# Patient Record
Sex: Male | Born: 1960 | Race: White | Hispanic: No | Marital: Married | Smoking: Never smoker
Health system: Southern US, Community
[De-identification: ages and names within clinical notes are randomized; demographics above are authoritative.]

---

## 2016-04-11 ENCOUNTER — Encounter: Payer: Self-pay | Admitting: Psychiatry

## 2016-04-11 ENCOUNTER — Inpatient Hospital Stay
Admission: RE | Admit: 2016-04-11 | Discharge: 2016-04-14 | DRG: 897 | Disposition: A | Discharge status: Home / Self Care | Payer: BLUE CROSS/BLUE SHIELD | Source: Intra-hospital | Attending: Psychiatry | Admitting: Psychiatry

## 2016-04-11 DIAGNOSIS — Z6838 Body mass index (BMI) 38.0-38.9, adult: Secondary | ICD-10-CM | POA: Diagnosis not present

## 2016-04-11 DIAGNOSIS — Z86718 Personal history of other venous thrombosis and embolism: Secondary | ICD-10-CM

## 2016-04-11 DIAGNOSIS — I82409 Acute embolism and thrombosis of unspecified deep veins of unspecified lower extremity: Secondary | ICD-10-CM

## 2016-04-11 DIAGNOSIS — Z7901 Long term (current) use of anticoagulants: Secondary | ICD-10-CM

## 2016-04-11 DIAGNOSIS — Z79899 Other long term (current) drug therapy: Secondary | ICD-10-CM | POA: Diagnosis not present

## 2016-04-11 DIAGNOSIS — F29 Unspecified psychosis not due to a substance or known physiological condition: Secondary | ICD-10-CM | POA: Diagnosis present

## 2016-04-11 DIAGNOSIS — M109 Gout, unspecified: Secondary | ICD-10-CM | POA: Diagnosis present

## 2016-04-11 DIAGNOSIS — I10 Essential (primary) hypertension: Secondary | ICD-10-CM | POA: Diagnosis present

## 2016-04-11 DIAGNOSIS — F1210 Cannabis abuse, uncomplicated: Secondary | ICD-10-CM

## 2016-04-11 DIAGNOSIS — F12150 Cannabis abuse with psychotic disorder with delusions: Secondary | ICD-10-CM | POA: Diagnosis present

## 2016-04-11 MED ORDER — MAGNESIUM HYDROXIDE 400 MG/5ML PO SUSP: 30.0000 mL | Freq: Every day | ORAL | Status: DC | PRN

## 2016-04-11 MED ORDER — DIPHENHYDRAMINE HCL 50 MG/ML IJ SOLN: 50.0000 mg | Freq: Three times a day (TID) | INTRAMUSCULAR | Status: DC | PRN

## 2016-04-11 MED ORDER — FINASTERIDE 5 MG PO TABS
2.5000 mg | ORAL_TABLET | Freq: Every day | ORAL | Status: DC
  Administered 2016-04-12: 2.5 mg via ORAL
  Filled 2016-04-11: qty 0.5

## 2016-04-11 MED ORDER — ACETAMINOPHEN 325 MG PO TABS
650.0000 mg | ORAL_TABLET | Freq: Four times a day (QID) | ORAL | Status: DC | PRN
Start: 2016-04-11 — End: 2016-04-13

## 2016-04-11 MED ORDER — ATORVASTATIN CALCIUM 20 MG PO TABS: 10.0000 mg | ORAL_TABLET | Freq: Every day | ORAL | Status: DC

## 2016-04-11 MED ORDER — ALUM & MAG HYDROXIDE-SIMETH 200-200-20 MG/5ML PO SUSP: 30.0000 mL | ORAL | Status: DC | PRN

## 2016-04-11 MED ORDER — RIVAROXABAN 20 MG PO TABS
20.0000 mg | ORAL_TABLET | Freq: Every day | ORAL | Status: DC
  Administered 2016-04-12 – 2016-04-13 (×2): 20 mg via ORAL
  Filled 2016-04-11 (×2): qty 1

## 2016-04-11 MED ORDER — DIPHENHYDRAMINE HCL 25 MG PO CAPS: 50.0000 mg | ORAL_CAPSULE | Freq: Three times a day (TID) | ORAL | Status: DC | PRN

## 2016-04-11 MED ORDER — HALOPERIDOL 5 MG PO TABS: 5.0000 mg | ORAL_TABLET | Freq: Three times a day (TID) | ORAL | Status: DC | PRN

## 2016-04-11 MED ORDER — LORAZEPAM 2 MG/ML IJ SOLN: 2.0000 mg | INTRAMUSCULAR | Status: DC | PRN

## 2016-04-11 MED ORDER — HYDROCHLOROTHIAZIDE 12.5 MG PO CAPS
12.5000 mg | ORAL_CAPSULE | Freq: Every day | ORAL | Status: DC
  Administered 2016-04-12 – 2016-04-13 (×2): 12.5 mg via ORAL
  Filled 2016-04-11 (×3): qty 1

## 2016-04-11 MED ORDER — LORAZEPAM 2 MG PO TABS: 2.0000 mg | ORAL_TABLET | ORAL | Status: DC | PRN

## 2016-04-11 MED ORDER — LISINOPRIL 10 MG PO TABS
20.0000 mg | ORAL_TABLET | Freq: Every day | ORAL | Status: DC
  Administered 2016-04-12 – 2016-04-14 (×3): 20 mg via ORAL
  Filled 2016-04-11 (×3): qty 2

## 2016-04-11 MED ORDER — HALOPERIDOL LACTATE 5 MG/ML IJ SOLN
5.0000 mg | Freq: Three times a day (TID) | INTRAMUSCULAR | Status: DC | PRN
Start: 2016-04-11 — End: 2016-04-14

## 2016-04-11 MED ORDER — DOCUSATE SODIUM 100 MG PO CAPS
100.0000 mg | ORAL_CAPSULE | Freq: Two times a day (BID) | ORAL | Status: DC
  Administered 2016-04-12: 100 mg via ORAL
  Filled 2016-04-11: qty 1

## 2016-04-11 NOTE — BH Assessment (Signed)
Patient has been accepted to [facility].  Patient assigned to room 23 Accepting physician is Dr. Ardyth HarpsHernandez Attending physician is Dr. Jennet MaduroPucilowska Call report to [phone] ER Nurse Zenda AlpersMaddi at [facility] made aware of acceptance, phone number to call, and accepting doctor.
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
## 2016-04-11 NOTE — Tx Team (Signed)
Initial Treatment Plan 04/11/2016 10:45 PM Jeffrey DialBrian Keith Sosa [id number]    PATIENT STRESSORS: Marital or family conflict Medication change or noncompliance   PATIENT STRENGTHS: Ability for insight Capable of independent living   PATIENT IDENTIFIED PROBLEMS: Axiety  Hallucinations  Delusions  Aggressive behavior               DISCHARGE CRITERIA:  Ability to meet basic life and health needs Improved stabilization in mood, thinking, and/or behavior Reduction of life-threatening or endangering symptoms to within safe limits  PRELIMINARY DISCHARGE PLAN: Attend PHP/IOP Return to previous living arrangement  PATIENT/FAMILY INVOLVEMENT: This treatment plan has been presented to and reviewed with the patient, Jeffrey Greer.  The patient has been given the opportunity to ask questions and make suggestions.  Olin PiaByungura, Samanvi Cuccia, RN 04/11/2016, 10:45 PM
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
## 2016-04-12 ENCOUNTER — Inpatient Hospital Stay: Payer: BLUE CROSS/BLUE SHIELD

## 2016-04-12 ENCOUNTER — Encounter: Payer: Self-pay | Admitting: Psychiatry

## 2016-04-12 DIAGNOSIS — M109 Gout, unspecified: Secondary | ICD-10-CM

## 2016-04-12 DIAGNOSIS — I82409 Acute embolism and thrombosis of unspecified deep veins of unspecified lower extremity: Secondary | ICD-10-CM

## 2016-04-12 DIAGNOSIS — I10 Essential (primary) hypertension: Secondary | ICD-10-CM

## 2016-04-12 DIAGNOSIS — F1210 Cannabis abuse, uncomplicated: Secondary | ICD-10-CM

## 2016-04-12 DIAGNOSIS — F29 Unspecified psychosis not due to a substance or known physiological condition: Secondary | ICD-10-CM

## 2016-04-12 LAB — COMPREHENSIVE METABOLIC PANEL
ALBUMIN: 3.8 g/dL (ref 3.5–5.0)
ALK PHOS: 67 U/L (ref 38–126)
ALT: 15 U/L — ABNORMAL LOW (ref 17–63)
ANION GAP: 7 (ref 5–15)
AST: 19 U/L (ref 15–41)
BILIRUBIN TOTAL: 1.1 mg/dL (ref 0.3–1.2)
BUN: 10 mg/dL (ref 6–20)
CALCIUM: 9.3 mg/dL (ref 8.9–10.3)
CO2: 31 mmol/L (ref 22–32)
Chloride: 103 mmol/L (ref 101–111)
Creatinine, Ser: 0.94 mg/dL (ref 0.61–1.24)
GFR calc Af Amer: >60 mL/min (ref >60)
GFR calc non Af Amer: >60 mL/min (ref >60)
GLUCOSE: 114 mg/dL — AB (ref 65–99)
Potassium: 3.7 mmol/L (ref 3.5–5.1)
Sodium: 141 mmol/L (ref 135–145)
TOTAL PROTEIN: 7.5 g/dL (ref 6.5–8.1)

## 2016-04-12 LAB — VITAMIN B12: VITAMIN B 12: 227 pg/mL (ref 180–914)

## 2016-04-12 LAB — RAPID HIV SCREEN (HIV 1/2 AB+AG)
HIV 1/2 ANTIBODIES: NONREACTIVE
HIV-1 P24 ANTIGEN - HIV24: NONREACTIVE

## 2016-04-12 LAB — CBC
HCT: 45 % (ref 40.0–52.0)
Hemoglobin: 15.3 g/dL (ref 13.0–18.0)
MCH: 31.6 pg (ref 26.0–34.0)
MCHC: 34.1 g/dL (ref 32.0–36.0)
MCV: 92.7 fL (ref 80.0–100.0)
Platelets: 190 10*3/uL (ref 150–440)
RBC: 4.85 MIL/uL (ref 4.40–5.90)
RDW: 14.2 % (ref 11.5–14.5)
WBC: 8.2 10*3/uL (ref 3.8–10.6)

## 2016-04-12 LAB — LIPID PANEL
Cholesterol: 150 mg/dL (ref 0–200)
HDL: 46 mg/dL (ref >40)
LDL Cholesterol: 87 mg/dL (ref 0–99)
Total CHOL/HDL Ratio: 3.3 RATIO
Triglycerides: 86 mg/dL (ref <150)
VLDL: 17 mg/dL (ref 0–40)

## 2016-04-12 LAB — TSH: TSH: 0.826 u[IU]/mL (ref 0.350–4.500)

## 2016-04-12 LAB — AMMONIA: Ammonia: 13 umol/L (ref 9–35)

## 2016-04-12 LAB — GLUCOSE, CAPILLARY: GLUCOSE-CAPILLARY: 97 mg/dL (ref 65–99)

## 2016-04-12 MED ORDER — FINASTERIDE 5 MG PO TABS
2.5000 mg | ORAL_TABLET | Freq: Every day | ORAL | Status: DC
  Administered 2016-04-13: 2.5 mg via ORAL
  Filled 2016-04-12 (×2): qty 1

## 2016-04-12 MED ORDER — ALLOPURINOL 100 MG PO TABS
200.0000 mg | ORAL_TABLET | Freq: Every day | ORAL | Status: DC
  Administered 2016-04-12 – 2016-04-14 (×3): 200 mg via ORAL
  Filled 2016-04-12 (×3): qty 2

## 2016-04-12 NOTE — [facility] Suicide Risk Assessment (Signed)
[facility]   Nursing information obtained from:    Demographic factors:    Current Mental Status:    Loss Factors:    Historical Factors:    Risk Reduction Factors:     Total Time spent with patient: 1 hour Principal Problem: Psychosis Diagnosis:   Patient Active Problem List   Diagnosis Date Noted  . Morbid obesity (HCC) [E66.01] 04/12/2016  . Gout [M10.9] 04/12/2016  . HTN (hypertension) [I10] 04/12/2016  . Cannabis use disorder, mild, abuse [F12.10] 04/12/2016  . Unspecified schizophrenia spectrum disorder [F29] 04/12/2016  . past hisoty of DVT (deep venous thrombosis) (HCC) [I82.409] 04/12/2016   Subjective Data:   Continued Clinical Symptoms:  Alcohol Use Disorder Identification Test Final Score (AUDIT): 0 The "Alcohol Use Disorders Identification Test", Guidelines for Use in Primary Care, Second Edition.  World Science writerHealth Organization [facility](WHO). Score between 0-7:  no or low risk or alcohol related problems. Score between 8-15:  moderate risk of alcohol related problems. Score between 16-19:  high risk of alcohol related problems. Score 20 or above:  warrants further diagnostic evaluation for alcohol dependence and treatment.   CLINICAL FACTORS:   Alcohol/Substance Abuse/Dependencies Currently Psychotic Medical Diagnoses and Treatments/Surgeries     Psychiatric Specialty Exam: Physical Exam  ROS  Blood pressure (!) 144/90, pulse 78, temperature 98.1 F (36.7 C), temperature source Oral, resp. rate 18, height 6\' 3"  (1.905 m), weight (!) 138.8 kg (306 lb), SpO2 100 %.Body mass index is 38.25 kg/m.                                                    Sleep:  Number of Hours: 6.15      COGNITIVE FEATURES THAT CONTRIBUTE TO RISK:  Loss of executive function    SUICIDE RISK:   Moderate:  Frequent suicidal ideation with limited intensity, and duration, some specificity in terms of plans, no associated intent, good  self-control, limited dysphoria/symptomatology, some risk factors present, and identifiable protective factors, including available and accessible social support.   [facility] OF CARE[facility]  I certify that inpatient services furnished can reasonably be expected to improve the patient's condition.  Jimmy FootmanHernandez-Gonzalez,  Alexys Gassett, MD 04/12/2016, 12:26 PM
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
## 2016-04-12 NOTE — [facility] Suicide Risk Assessment (Signed)
[facility] INPATIENT:  Family/Significant Other Suicide Prevention Education  Suicide Prevention Education:  Education Completed; Electa SniffGeraldine McCollum, friend, [phone], has been identified by the patient as the family member/significant other with whom the patient will be residing, and identified as the person(s) who will aid the patient in the event of a mental health crisis (suicidal ideations/suicide attempt).  With written consent from the patient, the family member/significant other has been provided the following suicide prevention education, prior to the and/or following the discharge of the patient.  The suicide prevention education provided includes the following:  Suicide risk factors  Suicide prevention and interventions  National Suicide Hotline telephone number  [facility] assessment telephone number  [facility] Emergency Assistance 911  [facility] and/or Residential Mobile Crisis Unit telephone number  Request made of family/significant other to:  Remove weapons (e.g., guns, rifles, knives), all items previously/currently identified as safety concern.    Remove drugs/medications (over-the-counter, prescriptions, illicit drugs), all items previously/currently identified as a safety concern.  The family member/significant other verbalizes understanding of the suicide prevention education information provided.   Alvira PhilipsGeraldine reports pt has multiple guns and is a Tourist information centre managercollector.  She was unable to agree that she could secure all of these guns.  She was agreeable to checking in regularly with pt regarding any safety concerns.   Lorri FrederickWierda, Antrone Walla Jon, LCSW 04/12/2016, 3:09 PM

## 2016-04-12 NOTE — Tx Team (Signed)
Interdisciplinary Treatment and Diagnostic Plan Update  04/12/2016 Time of Session: 10:30am Jeffrey Greer MRN: [id number]  Principal Diagnosis: <principal problem not specified>  Secondary Diagnoses: Active Problems:   Psychosis   Current Medications:  Current Facility-Administered Medications  Medication Dose Route Frequency Provider Last Rate Last Dose  . acetaminophen (TYLENOL) tablet 650 mg  650 mg Oral Q6H PRN Jimmy FootmanAndrea Hernandez-Gonzalez, MD      . alum & mag hydroxide-simeth (MAALOX/MYLANTA) 200-200-20 MG/5ML suspension 30 mL  30 mL Oral Q4H PRN Jimmy FootmanAndrea Hernandez-Gonzalez, MD      . diphenhydrAMINE (BENADRYL) capsule 50 mg  50 mg Oral Q8H PRN Jimmy FootmanAndrea Hernandez-Gonzalez, MD       Or  . diphenhydrAMINE (BENADRYL) injection 50 mg  50 mg Intramuscular Q8H PRN Jimmy FootmanAndrea Hernandez-Gonzalez, MD      . Melene Muller[START ON 04/13/2016] finasteride (PROSCAR) tablet 2.5 mg  2.5 mg Oral QHS Jimmy FootmanAndrea Hernandez-Gonzalez, MD      . haloperidol (HALDOL) tablet 5 mg  5 mg Oral Q8H PRN Jimmy FootmanAndrea Hernandez-Gonzalez, MD       Or  . haloperidol lactate (HALDOL) injection 5 mg  5 mg Intramuscular Q8H PRN Jimmy FootmanAndrea Hernandez-Gonzalez, MD      . hydrochlorothiazide (MICROZIDE) capsule 12.5 mg  12.5 mg Oral Daily Jimmy FootmanAndrea Hernandez-Gonzalez, MD   12.5 mg at 04/12/16 [id number]  . lisinopril (PRINIVIL,ZESTRIL) tablet 20 mg  20 mg Oral Daily Jimmy FootmanAndrea Hernandez-Gonzalez, MD   20 mg at 04/12/16 [id number]  . LORazepam (ATIVAN) tablet 2 mg  2 mg Oral Q4H PRN Jimmy FootmanAndrea Hernandez-Gonzalez, MD       Or  . LORazepam (ATIVAN) injection 2 mg  2 mg Intramuscular Q4H PRN Jimmy FootmanAndrea Hernandez-Gonzalez, MD      . magnesium hydroxide (MILK OF MAGNESIA) suspension 30 mL  30 mL Oral Daily PRN Jimmy FootmanAndrea Hernandez-Gonzalez, MD      . rivaroxaban Carlena Hurl(XARELTO) tablet 20 mg  20 mg Oral Q supper Jimmy FootmanAndrea Hernandez-Gonzalez, MD       PTA Medications: No prescriptions prior to admission.    Patient Stressors: Marital or family conflict Medication change or  noncompliance  Patient Strengths: Ability for insight Capable of independent living  Treatment Modalities: Medication Management, Group therapy, Case management,  1 to 1 session with clinician, Psychoeducation, Recreational therapy.   Physician Treatment Plan for Primary Diagnosis: <principal problem not specified> Long Term Goal(s):     Short Term Goals:    Medication Management: Evaluate patient's response, side effects, and tolerance of medication regimen.  Therapeutic Interventions: 1 to 1 sessions, Unit Group sessions and Medication administration.  Evaluation of Outcomes: Progressing  Physician Treatment Plan for Secondary Diagnosis: Active Problems:   Psychosis  Long Term Goal(s):     Short Term Goals:       Medication Management: Evaluate patient's response, side effects, and tolerance of medication regimen.  Therapeutic Interventions: 1 to 1 sessions, Unit Group sessions and Medication administration.  Evaluation of Outcomes: Progressing   RN Treatment Plan for Primary Diagnosis: <principal problem not specified> Long Term Goal(s): Knowledge of disease and therapeutic regimen to maintain health will improve  Short Term Goals: Ability to verbalize frustration and anger appropriately will improve, Ability to participate in decision making will improve and Ability to identify and develop effective coping behaviors will improve  Medication Management: RN will administer medications as ordered by provider, will assess and evaluate patient's response and provide education to patient for prescribed medication. RN will report any adverse and/or side effects to prescribing provider.  Therapeutic Interventions: 1 on 1  counseling sessions, Psychoeducation, Medication administration, Evaluate responses to treatment, Monitor vital signs and CBGs as ordered, Perform/monitor CIWA, COWS, AIMS and Fall Risk screenings as ordered, Perform wound care treatments as  ordered.  Evaluation of Outcomes: Progressing   LCSW Treatment Plan for Primary Diagnosis: <principal problem not specified> Long Term Goal(s): Safe transition to appropriate next level of care at discharge, Engage patient in therapeutic group addressing interpersonal concerns.  Short Term Goals: Engage patient in aftercare planning with referrals and resources, Increase social support, Increase ability to appropriately verbalize feelings, Increase emotional regulation, Facilitate acceptance of mental health diagnosis and concerns and Increase skills for wellness and recovery  Therapeutic Interventions: Assess for all discharge needs, 1 to 1 time with Social worker, Explore available resources and support systems, Assess for adequacy in community support network, Educate family and significant other(s) on suicide prevention, Complete Psychosocial Assessment, Interpersonal group therapy.  Evaluation of Outcomes: Progressing   Progress in Treatment: Attending groups: No. CSW still assessing, pt new to milieu Participating in groups: No. CSW still assessing, pt new to milieu Taking medication as prescribed: Yes. Toleration medication: Yes. Family/Significant other contact made: No, will contact:  pt's family Patient understands diagnosis: Yes. Discussing patient identified problems/goals with staff: Yes. Medical problems stabilized or resolved: Yes. Denies suicidal/homicidal ideation: Yes. Issues/concerns per patient self-inventory: Yes. Other: none listed   New problem(s) identified: No, Describe:  none listed  New Short Term/Long Term Goal(s):  Discharge Plan or Barriers:   Reason for Continuation of Hospitalization: Anxiety Delusions  Depression Hallucinations Mania  Estimated Length of Stay: 3-5 days  Attendees: [name] 04/12/2016 10:46 AM  Physician: Dr. Ardyth HarpsHernandez, MD 04/12/2016 10:46 AM  Nursing: Elenore PaddyJennifer Morrow, R 04/12/2016 10:46 AM  RN Care Manager:  04/12/2016 10:46 AM  Social Worker: Jeffrey Greer, LCSWA, LCAS   04/12/2016 10:46 AM  Recreational Therapist: Hershal CoriaBeth Greene, LRT 04/12/2016 10:46 AM  Other:  04/12/2016 10:46 AM  Other:  04/12/2016 10:46 AM  Other: 04/12/2016 10:46 AM    Scribe for Treatment Team: Jeffrey PeaJonathan F Mirel Hundal, LCSWA 04/12/2016

## 2016-04-12 NOTE — Progress Notes (Signed)
Patient with sad affect, cooperative behavior with meals and plan of care. No SI/HI at this time. Meets with MD to discuss plan of care. MD considers med adjustments. Orient to unit and therapy groups encouraged. Safety maintained.

## 2016-04-12 NOTE — Progress Notes (Signed)
Recreation Therapy Notes  Date: 12.20.17 Time: 9:30 am Location: Craft Room  Group Topic: Self-esteem  Goal Area(s) Addresses:  Patient will write at least one positive trait. Patient will verbalize benefit of having healthy self-esteem.  Behavioral Response: Did not attend  Intervention: I Am  Activity: Patients were given a worksheet with the letter I on it and were instructed to write as many positive traits inside the letter.  Education: LRT educated patients on ways they can increase their self-esteem.  Education Outcome: Patient did not attend group.  Clinical Observations/Feedback: Patient did not attend group.  Jacquelynn CreeGreene,Eldene Plocher M, LRT/CTRS 04/12/2016 10:04 AM

## 2016-04-12 NOTE — [facility] Group Notes (Signed)
[facility] Group Notes:  (Nursing/MHT/Case Management/Adjunct)  Date:  04/12/2016  Time:  4:40 PM  Type of Therapy:  Psychoeducational Skills  Participation Level:  Did Not Attend  Twanna Hymanda C Jamilex Bohnsack 04/12/2016, 4:40 PM

## 2016-04-12 NOTE — Progress Notes (Signed)
Skin search done with Royetta Crochetina RN. Patient lower extremities are cyanotic and ashy. They are also edematous, both are 3+. No contraband found.
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
## 2016-04-12 NOTE — H&P (Addendum)
Psychiatric Admission Assessment Adult  Patient Identification: Jeffrey Greer MRN:  [id number] Date of Evaluation:  04/12/2016 Chief Complaint:  Psychotic Disorder  Principal Diagnosis: Psychosis Diagnosis:   Patient Active Problem List   Diagnosis Date Noted  . Morbid obesity ([place]) [E66.01] 04/12/2016  . Gout [M10.9] 04/12/2016  . HTN (hypertension) [I10] 04/12/2016  . Cannabis use disorder, mild, abuse [F12.10] 04/12/2016  . Unspecified schizophrenia spectrum disorder [F29] 04/12/2016  . past hisoty of DVT (deep venous thrombosis) (H[facility]) [I82.409] 04/12/2016   History of Present Illness:   The patient is a 55 year old separated Caucasian male from [place]. He was referred to our unit by [facility] emergency department. The patient was brought to [facility] on by police under petition. Per the notes on December 16 the police was called out to the home after family is stated patient was threatening them with knives. Patient was found on with a gun and had a stand off with police for 4 hours. He had disjointed thoughts, delusions of grandeur. Obsession with religious ideology. He stated he was shooting holes in the ground to ward off evil one. Afraid he is being poisoned by neighbors. Believes that he had a tracking device planted in him.  On December 17 per [place]'s notes patient was labile, bizarre and had flight of ideas he appeared to be responding to internal stimuli. He was again reported that his neighbors were poisoning his water.  Urine toxicology was positive for marijuana. Alcohol was below detection limit. CBC and comprehensive metabolic panel were within the normal limits. Vital signs were within the normal limits.  Patient denies having any guns with him when the police came to his house. He doesn't know who called the police as he says he lives alone. He denies thinking that his neighbors are trying to harm him. He denies thinking that there were spying on him  or that anybody has implanted a tracking device on him. Patient tells me he is very hospitalized or treated for any psychiatric conditions in the past.  He denies depressed mood, problems with sleep, appetite, energy or concentration. He denies auditory or visual hallucinations. He denies any symptoms consistent with mania or hypomania.  As far as substance abuse he denies the use of alcohol or any illicit substances. He denies the use of cigarettes. He was positive for marijuana in his tox screen  Trauma history he denies any history of sexual or physical abuse or any other traumatic events in the past.  During assessment the patient was vague and guarded. He is focused on returning back to work because he is tired of being at home not doing anything. Patient says his aggravated by his neighbors who are using drugs and keep asking him for money.  Associated Signs/Symptoms: Depression Symptoms:  denies (Hypo) Manic Symptoms:  Labiality of Mood, Anxiety Symptoms:  denies Psychotic Symptoms:  Paranoia, PTSD Symptoms: Negative Total Time spent with patient: 1 hour  Past Psychiatric History: Patient denies any prior psychiatric treatment. Denies any history of suicidal attempts, psychiatric hospitalizations, denies history of self injury.  Is the patient at risk to self? Yes.    Has the patient been a risk to self in the past 6 months? No.  Has the patient been a risk to self within the distant past? No.  Is the patient a risk to others? Yes.    Has the patient been a risk to others in the past 6 months? No.  Has the patient been a risk  to others within the distant past? No.   Prior Inpatient Therapy: Prior Inpatient Therapy: No Prior Outpatient Therapy: Prior Outpatient Therapy: No Does patient have an ACCT team?: No Does patient have Intensive In-House Services?  : No Does patient have [facility] services? : No  Alcohol Screening: Patient refused Alcohol Screening Tool: Yes 1. How often  do you have a drink containing alcohol?: Never 9. Have you or someone else been injured as a result of your drinking?: No 10. Has a relative or friend or a doctor or another health worker been concerned about your drinking or suggested you cut down?: No Alcohol Use Disorder Identification Test Final Score (AUDIT): 0 Brief Intervention: Patient declined brief intervention  Past Medical History: Patient reports a past history of deep venous [place] cyst on his left leg which he is on direct Xarelto. He takes medications for hypertension. Says that in the past he was diagnosed with diabetes and dyslipidemia but is no longer prescribed medications for these disorders as he lost about 200 pounds in 1 year.  Denies any history of seizures or head trauma  Family History: History reviewed. No pertinent family history.   Family Psychiatric  History: Patient says he was adopted and doesn't know much about his biological family  Tobacco Screening: Have you used any form of tobacco in the last 30 days? (Cigarettes, Smokeless Tobacco, Cigars, and/or Pipes): No   Social History: Patient says he lives alone in a house in [place]. He has been on disability for several years due to morbid obesity and DVTs. 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 He did some odd jobs after that but has been at home unemployed for at least 4 years. Patient has been married twice. He divorced his first wife and then remarried. He is what led to being in June of this year. Patient has 2 children ages 27 and 83 but the patient has no contact with them. 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 He denies any Careers adviser. As far as his legal history patient says that in the past he was discharged with having a gun that was not register but he says that the charges were dropped.  Patient says he was adopted he is adopted  parents have passed away. He has an adopted sister but does not have any contact with her History  Alcohol use Not on file     History  Drug Use No    Additional Social History:      Pain Medications: see MAR Prescriptions: see MAR Over the Counter: see MAR History of alcohol / drug use?: No history of alcohol / drug abuse     Allergies:  Not on File   Lab Results:  Results for orders placed or performed during the hospital encounter of 04/11/16 (from the past 48 hour(s))  CBC     Status: None   Collection Time: 04/12/16  6:37 AM  Result Value Ref Range   WBC 8.2 3.8 - 10.6 K/uL   RBC 4.85 4.40 - 5.90 MIL/uL   Hemoglobin 15.3 13.0 - 18.0 g/dL   HCT 45.0 40.0 - 52.0 %   MCV 92.7 80.0 - 100.0 fL   MCH 31.6 26.0 - 34.0 pg   MCHC 34.1 32.0 - 36.0 g/dL   RDW 14.2 11.5 - 14.5 %   Platelets 190 150 - 440 K/uL  Comprehensive metabolic panel     Status: Abnormal   Collection Time: 04/12/16  6:37 AM  Result Value Ref Range   Sodium 141 135 - 145 mmol/L   Potassium 3.7 3.5 - 5.1 mmol/L   Chloride 103 101 - 111 mmol/L   CO2 31 22 - 32 mmol/L   Glucose, Bld 114 (H) 65 - 99 mg/dL   BUN 10 6 - 20 mg/dL   Creatinine, Ser 0.94 0.61 - 1.24 mg/dL   Calcium 9.3 8.9 - 10.3 mg/dL   Total Protein 7.5 6.5 - 8.1 g/dL   Albumin 3.8 3.5 - 5.0 g/dL   AST 19 15 - 41 U/L   ALT 15 (L) 17 - 63 U/L   Alkaline Phosphatase 67 38 - 126 U/L   Total Bilirubin 1.1 0.3 - 1.2 mg/dL   GFR calc non Af Amer >60 >60 mL/min   GFR calc Af Amer >60 >60 mL/min    Comment: (NOTE) The eGFR has been calculated using the CKD EPI equation. This calculation has not been validated in all clinical situations. eGFR's persistently <60 mL/min signify possible Chronic Kidney Disease.    Anion gap 7 5 - 15  Lipid panel     Status: None   Collection Time: 04/12/16  6:37 AM  Result Value Ref Range   Cholesterol 150 0 - 200 mg/dL   Triglycerides 86 <150 mg/dL   HDL 46 >40 mg/dL   Total CHOL/HDL Ratio 3.3 RATIO    VLDL 17 0 - 40 mg/dL   LDL Cholesterol 87 0 - 99 mg/dL    Comment:        Total Cholesterol/HDL:CHD Risk Coronary Heart Disease Risk Table                     Men   Women  1/2 Average Risk   3.4   3.3  Average Risk       5.0   4.4  2 X Average Risk   9.6   7.1  3 X Average Risk  23.4   11.0        Use the calculated Patient Ratio above and the CHD Risk Table to determine the patient's CHD Risk.        ATP III CLASSIFICATION (LDL):  <100     mg/dL   Optimal  100-129  mg/dL   Near or Above                    Optimal  130-159  mg/dL   Borderline  160-189  mg/dL   High  >190     mg/dL   Very High   TSH     Status: None   Collection Time: 04/12/16  6:37 AM  Result Value Ref Range   TSH 0.826 0.350 - 4.500 uIU/mL    Comment: Performed by a 3rd Generation assay with a functional sensitivity of <=0.01 uIU/mL.  Glucose, capillary     Status: None   Collection Time: 04/12/16  7:39 AM  Result Value Ref Range   Glucose-Capillary 97 65 - 99 mg/dL    Blood Alcohol level:  No results found for: [facility]  Metabolic Disorder Labs:  No results found for: HGBA1C, MPG No results found for: PROLACTIN Lab Results  Component Value Date   CHOL 150 04/12/2016   TRIG 86 04/12/2016   HDL 46 04/12/2016   CHOLHDL 3.3 04/12/2016   VLDL 17 04/12/2016   LDLCALC 87 04/12/2016  Current Medications: Current Facility-Administered Medications  Medication Dose Route Frequency Provider Last Rate Last Dose  . acetaminophen (TYLENOL) tablet 650 mg  650 mg Oral Q6H PRN Hildred Priest, MD      . alum & mag hydroxide-simeth (MAALOX/MYLANTA) 200-200-20 MG/5ML suspension 30 mL  30 mL Oral Q4H PRN Hildred Priest, MD      . diphenhydrAMINE (BENADRYL) capsule 50 mg  50 mg Oral Q8H PRN Hildred Priest, MD       Or  . diphenhydrAMINE (BENADRYL) injection 50 mg  50 mg Intramuscular Q8H PRN Hildred Priest, MD      . Derrill Memo ON 04/13/2016] finasteride (PROSCAR) tablet 2.5  mg  2.5 mg Oral QHS Hildred Priest, MD      . haloperidol (HALDOL) tablet 5 mg  5 mg Oral Q8H PRN Hildred Priest, MD       Or  . haloperidol lactate (HALDOL) injection 5 mg  5 mg Intramuscular Q8H PRN Hildred Priest, MD      . hydrochlorothiazide (MICROZIDE) capsule 12.5 mg  12.5 mg Oral Daily Hildred Priest, MD   12.5 mg at 04/12/16 4742  . lisinopril (PRINIVIL,ZESTRIL) tablet 20 mg  20 mg Oral Daily Hildred Priest, MD   20 mg at 04/12/16 5956  . LORazepam (ATIVAN) tablet 2 mg  2 mg Oral Q4H PRN Hildred Priest, MD       Or  . LORazepam (ATIVAN) injection 2 mg  2 mg Intramuscular Q4H PRN Hildred Priest, MD      . magnesium hydroxide (MILK OF MAGNESIA) suspension 30 mL  30 mL Oral Daily PRN Hildred Priest, MD      . rivaroxaban Alveda Reasons) tablet 20 mg  20 mg Oral Q supper Hildred Priest, MD       PTA Medications: No prescriptions prior to admission.    Musculoskeletal: Strength & Muscle Tone: within normal limits Gait & Station: normal Patient leans: N/A  Psychiatric Specialty Exam: Physical Exam  Constitutional: He is oriented to person, place, and time. He appears well-developed.  HENT:  Head: Normocephalic and atraumatic.  Eyes: Conjunctivae and EOM are normal.  Neck: Normal range of motion.  Respiratory: Effort normal.  Musculoskeletal: Normal range of motion.  Neurological: He is alert and oriented to person, place, and time.    Review of Systems  Constitutional: Negative.   HENT: Negative.   Eyes: Negative.   Respiratory: Negative.   Cardiovascular: Positive for leg swelling.  Gastrointestinal: Negative.   Genitourinary: Negative.   Musculoskeletal: Negative.   Skin: Positive for itching and rash.  Neurological: Negative.   Endo/Heme/Allergies: Negative.   Psychiatric/Behavioral: Negative.     Blood pressure (!) 144/90, pulse 78, temperature 98.1 F (36.7 C),  temperature source Oral, resp. rate 18, height 6' 3"  (1.905 m), weight (!) 138.8 kg (306 lb), SpO2 100 %.Body mass index is 38.25 kg/m.  General Appearance: Well Groomed  Eye Contact:  Good  Speech:  Clear and Coherent  Volume:  Normal  Mood:  Irritable  Affect:  Constricted  Thought Process:  Linear and Descriptions of Associations: Tangential  Orientation:  Full (Time, Place, and Person)  Thought Content:  Paranoid Ideation  Suicidal Thoughts:  No  Homicidal Thoughts:  No  Memory:  Immediate;   Fair Recent;   Good Remote;   Good  Judgement:  Impaired  Insight:  Lacking  Psychomotor Activity:  Normal  Concentration:  Concentration: Fair and Attention Span: Fair  Recall:  [employer] of Knowledge:  Fair  Language:  Kermit Balo  Akathisia:  No  Handed:    AIMS (if indicated):     Assets:  Agricultural consultant Housing  ADL's:  Intact  Cognition:  WNL  Sleep:  Number of Hours: 6.15    Treatment Plan Summary:  The patient is a 55 year old separated Caucasian male with no known prior psychiatric history. He was referred by [facility] emergency department due to aggression and bizarre behavior (paranoid delusions about his neighbors trying to poison him and having a tracking device inserted in his body)  For psychosis: no significant evidence of psychosis at this time, perhaps mild paranoia towards neighbors but he denies delusions. Will try to collect collateral from family.  Staff will observe closely.  Will hold off from starting antipsychotics  For agitation and aggression patient has orders for Haldol, Ativan and Benadryl when necessary IM or by mouth  For insomnia he has orders for Ativan when necessary  For history of DVT left leg he will be continued on Xarelto  For hypertension patient has been taking a combination pill with lisinopril and hydrochlorothiazide. He will be continued on hydrochlorothiazide 2.5 mg and lisinopril 20 mg a day  Past  history of diabetes: We will check hemoglobin A1c to rule out diabetes  Past history of dyslipidemia: We check a lipid panel which appeared to be in in the normal limits. Patient does not want to continue treatment with Lipitor will discontinue  Gout: Patient is states he has been taking allopurinol 100 mg day would like to continue this medication.  Cannabis use disorder patient denies the use of any alcohol or illicit substances. His urine toxicology screen was positive for cannabis. Upon discharge patient will be referred to outpatient substance abuse treatment  Diet carb modified and low sodium  Vital signs daily  Precautions every 15 minute checks  Hospitalization and status continue involuntary commitment  Labs: I will order B12, RPR, HIV, ammonia level, TSH  Imaging I will order a head CT without contrast  I reviewed his medical record. There are notes from his primary care provider Dr.Gallemore. There are no records indicating history of mental illness.  Dispo: once stable he will be d/c to his home  F/u: will set up a f/u with psychiatry.  Physician Treatment Plan for Primary Diagnosis: Psychosis Long Term Goal(s): Improvement in symptoms so as ready for discharge  Short Term Goals: Ability to identify changes in lifestyle to reduce recurrence of condition will improve, Ability to demonstrate self-control will improve, Ability to identify and develop effective coping behaviors will improve and Ability to identify triggers associated with substance abuse/mental health issues will improve  Physician Treatment Plan for Secondary Diagnosis: Principal Problem:   Unspecified schizophrenia spectrum disorder Active Problems:   Morbid obesity ([place])   Gout   HTN (hypertension)   Cannabis use disorder, mild, abuse   past hisoty of DVT (deep venous thrombosis) ([place])  Long Term Goal(s): Improvement in symptoms so as ready for discharge  Short Term Goals: Ability to demonstrate  self-control will improve and Ability to identify triggers associated with substance abuse/mental health issues will improve  I certify that inpatient services furnished can reasonably be expected to improve the patient's condition.    Hildred Priest, MD 12/20/201712:12 PM

## 2016-04-12 NOTE — Plan of Care (Signed)
Problem: Safety: Goal: Ability to redirect hostility and anger into socially appropriate behaviors will improve Outcome: Progressing No aggressive behavior or anger displayed

## 2016-04-12 NOTE — Progress Notes (Signed)
Patient was admitted and oriented to the unit. Had a snack and received sleeping medications. Currently in bed, sleeping soundly. No sign of discomfort noted. Safety precautions maintained.

## 2016-04-12 NOTE — Progress Notes (Signed)
April 13, 2016.  Patient Identification: Jeffrey Greer MRN:  [id number] Date of Evaluation:  04/12/2016 Chief Complaint:  Psychotic Disorder  Principal Diagnosis: Psychosis  To [facility]:  The patient is a 55 year old separated Caucasian male from [place]. He was referred to our unit [facility]. The patient was [facility] to [facility] by police under petition. Per the notes on December 16 the police was called out to the home after family stated patient was threatening them with knives. Patient was found on with a gun and had a stand off with police for 4 hours. He had disjointed thoughts, delusions of grandeur. Obsession with religious ideology. He stated he was shooting holes in the ground to ward off evil one. Afraid he is being poisoned by neighbors. Believes that he had a tracking device planted in him.   On December 17 per [place]'s notes patient was labile, bizarre and had flight of ideas he appeared to be responding to internal stimuli. He was again reported that his neighbors were poisoning his water.   Urine toxicology was positive for marijuana. Alcohol was below detection limit.   Patient denies having any guns with him when the police came to his house. He doesn't know who called the police as he says he lives alone. He denies thinking that his neighbors are trying to harm him. He denies thinking that there were spying on him or that anybody has implanted a tracking device on him. Patient tells me he is very hospitalized or treated for any psychiatric conditions in the past.    During assessment the patient was vague and guarded. He is focused on returning back to work because he is tired of  being at home not doing nothing. Patient says his aggravated by his neighbors who are using drugs and keep asking him for money.  We will need to obtain collateral information from the family.  Patient has been started on antipsychotics but  his condition has not improved.  Patient is not yet stable for discharge and we are recommending to extend her/his involuntary commitment for up to 30 days.  If more information is needed about this case, please do not hesitated to contact me at [phone].  Sincerely,  Radene JourneyAndrea Hernandez M.D. [phone] [facility]/Behavioral health Unit

## 2016-04-12 NOTE — Plan of Care (Signed)
Problem: Coping: Goal: Ability to cope will improve Outcome: Progressing Adjusting to the unit and able to communicate his needs and concerns

## 2016-04-13 LAB — RPR: RPR Ser Ql: NONREACTIVE

## 2016-04-13 LAB — HEMOGLOBIN A1C
HEMOGLOBIN A1C: 5 % (ref 4.8–5.6)
MEAN PLASMA GLUCOSE: 97 mg/dL

## 2016-04-13 LAB — GLUCOSE, CAPILLARY: Glucose-Capillary: 136 mg/dL — ABNORMAL HIGH (ref 65–99)

## 2016-04-13 MED ORDER — ACETAMINOPHEN 500 MG PO TABS: 1000.0000 mg | ORAL_TABLET | Freq: Four times a day (QID) | ORAL | Status: DC | PRN

## 2016-04-13 MED ORDER — VITAMIN B-12 1000 MCG PO TABS
1000.0000 ug | ORAL_TABLET | Freq: Every day | ORAL | Status: DC
  Administered 2016-04-13 – 2016-04-14 (×2): 1000 ug via ORAL
  Filled 2016-04-13 (×2): qty 1

## 2016-04-13 NOTE — Plan of Care (Signed)
Problem: Coping: Goal: Ability to verbalize feelings will improve Outcome: Progressing Patient verbalized feelings.    

## 2016-04-13 NOTE — Progress Notes (Signed)
Patient with sad affect, cooperative behavior with meals, meds and plan of care. No SI/HI at this time. Verbalizes needs appropriately with staff. Does well with encouragement and support. Minimal interaction with peers. Slightly suspicious with meds. Safety maintained.

## 2016-04-13 NOTE — [facility] Group Notes (Signed)
[facility] Group Notes:  (Nursing/MHT/Case Management/Adjunct)  Date:  04/13/2016  Time:  5:19 PM  Type of Therapy:  Psychoeducational Skills  Participation Level:  Active  Participation Quality:  Appropriate, Attentive, Sharing and Supportive  Affect:  Appropriate  Cognitive:  Appropriate  Insight:  Appropriate  Engagement in Group:  Engaged and Supportive  Modes of Intervention:  Discussion and Education  Summary of Progress/Problems:  Jeffrey Greer Jeffrey Greer Jeffrey Greer 04/13/2016, 5:19 PM

## 2016-04-13 NOTE — Progress Notes (Signed)
D: Pt denies SI/HI/AVH. Pt is pleasant and cooperative. Pt appears less anxious and he is interacting with peers and staff appropriately.  A: Pt was offered support and encouragement. Pt was given scheduled medications. Pt was encouraged to attend groups. Q 15 minute checks were done for safety.  R:Pt did not attend group. Pt is taking medication. Pt has no complaints.Pt receptive to treatment and safety maintained on unit.

## 2016-04-13 NOTE — [facility] Group Notes (Signed)
Goals Group  Date/Time: 04/13/2016 11:02AM Type of Therapy and Topic: Group Therapy: Goals Group: SMART Goals  ?  Participation Level: Moderate  ?  Description of Group:  ?  The purpose of a daily goals group is to assist and guide patients in setting recovery/wellness-related goals. The objective is to set goals as they relate to the crisis in which they were admitted. Patients will be using SMART goal modalities to set measurable goals. Characteristics of realistic goals will be discussed and patients will be assisted in setting and processing how one will reach their goal. Facilitator will also assist patients in applying interventions and coping skills learned in psycho-education groups to the SMART goal and process how one will achieve defined goal.  ?  Therapeutic Goals:  ?  -Patients will develop and document one goal related to or their crisis in which brought them into treatment.  -Patients will be guided by LCSW using SMART goal setting modality in how to set a measurable, attainable, realistic and time sensitive goal.  -Patients will process barriers in reaching goal.  -Patients will process interventions in how to overcome and successful in reaching goal.  ?  Patient's Goal: Pt stated that his goal for today is to take care of his legs. In order to accomplish this goal, he stated that he will keep it elevated. ?  Therapeutic Modalities:  Motivational Interviewing  Cognitive Behavioral Therapy  Crisis Intervention Model  SMART goals setting  Hampton AbbotKadijah Joelly Bolanos, MSW, LCSW-A 04/13/2016, 11:02AM

## 2016-04-13 NOTE — Progress Notes (Signed)
[facility] MD Progress Note  04/13/2016 12:14 PM Jeffrey Greer  MRN:  [id number] Subjective:  The patient is a 55 year old separated Caucasian male from [place]. He was referred to our unit by [facility] emergency department. The patient was brought to [facility] ([place]) on by police under petition. Per the notes on December 16 the police was called out to the home after family is stated patient was threatening them with knives. Patient was found on with a gun and had a stand off with police for 4 hours. He had disjointed thoughts, delusions of grandeur. Obsession with religious ideology. He stated he was shooting holes in the ground to ward off evil one. Afraid he is being poisoned by neighbors. Believes that he had a tracking device planted in him.  On December 17 per [facility]'s notes patient was labile, bizarre and had flight of ideas he appeared to be responding to internal stimuli. He was again reported that his neighbors were poisoning his water.  Urine toxicology was positive for marijuana. Alcohol was below detection limit. CBC and comprehensive metabolic panel were within the normal limits. Vital signs were within the normal limits.  Patient denies having any guns with him when the police came to his house. He doesn't know who called the police as he says he lives alone. He denies thinking that his neighbors are trying to harm him. He denies thinking that there were spying on him or that anybody has implanted a tracking device on him. Patient tells me he is very hospitalized or treated for any psychiatric conditions in the past.  12/21 patient has not had any episodes of irritability, aggression or agitation since his admission. He is not bizarre. He does not appear to be interacting to internal stimuli. He does not display any symptoms consistent with hypomania or with mania. He denies thinking that his neighbors were trying to poison him. He denies thinking that somebody was trying to  implement at tracking device in him. Denies having any intentions of harming himself, his neighbors or anybody else if he were to be discharged. Patient appears not to have much recollection of what happened the day the police showed up at his house. Patient acknowledges that he had been smoking marijuana and he thinks that marijuana might have caused the change in his behavior.   Per nursing: Patient with sad affect, cooperative behavior with meals, meds and plan of care. No SI/HI at this time. Verbalizes needs appropriately with staff. Does well with encouragement and support. Minimal interaction with peers. Slightly suspicious with meds. Safety maintained.   D: Pt denies SI/HI/AVH. Pt is pleasant and cooperative. Pt appears less anxious and he is interacting with peers and staff appropriately.  A: Pt was offered support and encouragement. Pt was given scheduled medications. Pt was encouraged to attend groups. Q 15 minute checks were done for safety.  R:Pt did not attend group. Pt is taking medication. Pt has no complaints.Pt receptive to treatment and safety maintained on unit.  Principal Problem: Psychosis Diagnosis:   Patient Active Problem List   Diagnosis Date Noted  . Morbid obesity ([place]) [E66.01] 04/12/2016  . Gout [M10.9] 04/12/2016  . HTN (hypertension) [I10] 04/12/2016  . Cannabis use disorder, mild, abuse [F12.10] 04/12/2016  . Unspecified schizophrenia spectrum disorder [F29] 04/12/2016  . 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 Denies any history of suicidal  attempts, psychiatric hospitalizations, denies history of self injury.  Past Medical History: History reviewed. No pertinent past medical history. History reviewed. No pertinent surgical history.   Family History: History reviewed. No pertinent family history.   Family  Psychiatric  History: Patient says he was adopted and doesn't know much about his biological family  Social History: Patient says he lives alone in a house in [place]. He has been on disability for several years due to morbid obesity and DVTs. 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 He did some odd jobs after that but has been at home unemployed for at least 4 years. Patient has been married twice. He divorced his first wife and then remarried. He is what led to being in June of this year. Patient has 2 children ages 17 and 72 but the patient has no contact with them. 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 He denies any Careers adviser. As far as his legal history patient says that in the past he was discharged with having a gun that was not register but he says that the charges were dropped.  Patient says he was adopted he is adopted parents have passed away. He has an adopted sister but does not have any contact with her History  Alcohol use Not on file     History  Drug Use No    Social History   Social History  . Marital status: Unknown    Spouse name: N/A  . Number of children: N/A  . Years of education: N/A   Social History Main Topics  . Smoking status: Never Smoker  . Smokeless tobacco: Never Used  . Alcohol use None  . Drug use: No  . Sexual activity: No   Other Topics Concern  . None   Social History Narrative  . None   Additional Social History:    Pain Medications: see MAR Prescriptions: see MAR Over the Counter: see MAR History of alcohol / drug use?: No history of alcohol / drug abuse       Current Medications: Current Facility-Administered Medications  Medication Dose Route Frequency Provider Last Rate Last Dose  . acetaminophen (TYLENOL) tablet 1,000 mg  1,000 mg Oral Q6H PRN Hildred Priest, MD      . allopurinol (ZYLOPRIM)  tablet 200 mg  200 mg Oral Daily Hildred Priest, MD   200 mg at 04/13/16 0801  . alum & mag hydroxide-simeth (MAALOX/MYLANTA) 200-200-20 MG/5ML suspension 30 mL  30 mL Oral Q4H PRN Hildred Priest, MD      . diphenhydrAMINE (BENADRYL) capsule 50 mg  50 mg Oral Q8H PRN Hildred Priest, MD       Or  . diphenhydrAMINE (BENADRYL) injection 50 mg  50 mg Intramuscular Q8H PRN Hildred Priest, MD      . finasteride (PROSCAR) tablet 2.5 mg  2.5 mg Oral QHS Hildred Priest, MD      . haloperidol (HALDOL) tablet 5 mg  5 mg Oral Q8H PRN Hildred Priest, MD       Or  . haloperidol lactate (HALDOL) injection 5 mg  5 mg Intramuscular Q8H PRN Hildred Priest, MD      . hydrochlorothiazide (MICROZIDE) capsule 12.5 mg  12.5 mg Oral Daily Hildred Priest, MD   12.5 mg at 04/13/16 0801  . lisinopril (PRINIVIL,ZESTRIL) tablet 20 mg  20 mg Oral Daily Hildred Priest, MD  20 mg at 04/13/16 0801  . LORazepam (ATIVAN) tablet 2 mg  2 mg Oral Q4H PRN Hildred Priest, MD       Or  . LORazepam (ATIVAN) injection 2 mg  2 mg Intramuscular Q4H PRN Hildred Priest, MD      . magnesium hydroxide (MILK OF MAGNESIA) suspension 30 mL  30 mL Oral Daily PRN Hildred Priest, MD      . rivaroxaban Alveda Reasons) tablet 20 mg  20 mg Oral Q supper Hildred Priest, MD   20 mg at 04/12/16 1647    Lab Results:  Results for orders placed or performed during the hospital encounter of 04/11/16 (from the past 48 hour(s))  CBC     Status: None   Collection Time: 04/12/16  6:37 AM  Result Value Ref Range   WBC 8.2 3.8 - 10.6 K/uL   RBC 4.85 4.40 - 5.90 MIL/uL   Hemoglobin 15.3 13.0 - 18.0 g/dL   HCT 45.0 40.0 - 52.0 %   MCV 92.7 80.0 - 100.0 fL   MCH 31.6 26.0 - 34.0 pg   MCHC 34.1 32.0 - 36.0 g/dL   RDW 14.2 11.5 - 14.5 %   Platelets 190 150 - 440 K/uL  Comprehensive metabolic panel     Status: Abnormal    Collection Time: 04/12/16  6:37 AM  Result Value Ref Range   Sodium 141 135 - 145 mmol/L   Potassium 3.7 3.5 - 5.1 mmol/L   Chloride 103 101 - 111 mmol/L   CO2 31 22 - 32 mmol/L   Glucose, Bld 114 (H) 65 - 99 mg/dL   BUN 10 6 - 20 mg/dL   Creatinine, Ser 0.94 0.61 - 1.24 mg/dL   Calcium 9.3 8.9 - 10.3 mg/dL   Total Protein 7.5 6.5 - 8.1 g/dL   Albumin 3.8 3.5 - 5.0 g/dL   AST 19 15 - 41 U/L   ALT 15 (L) 17 - 63 U/L   Alkaline Phosphatase 67 38 - 126 U/L   Total Bilirubin 1.1 0.3 - 1.2 mg/dL   GFR calc non Af Amer >60 >60 mL/min   GFR calc Af Amer >60 >60 mL/min    Comment: (NOTE) The eGFR has been calculated using the CKD EPI equation. This calculation has not been validated in all clinical situations. eGFR's persistently <60 mL/min signify possible Chronic Kidney Disease.    Anion gap 7 5 - 15  Lipid panel     Status: None   Collection Time: 04/12/16  6:37 AM  Result Value Ref Range   Cholesterol 150 0 - 200 mg/dL   Triglycerides 86 <150 mg/dL   HDL 46 >40 mg/dL   Total CHOL/HDL Ratio 3.3 RATIO   VLDL 17 0 - 40 mg/dL   LDL Cholesterol 87 0 - 99 mg/dL    Comment:        Total Cholesterol/HDL:CHD Risk Coronary Heart Disease Risk Table                     Men   Women  1/2 Average Risk   3.4   3.3  Average Risk       5.0   4.4  2 X Average Risk   9.6   7.1  3 X Average Risk  23.4   11.0        Use the calculated Patient Ratio above and the CHD Risk Table to determine the patient's CHD Risk.        ATP  III CLASSIFICATION (LDL):  <100     mg/dL   Optimal  100-129  mg/dL   Near or Above                    Optimal  130-159  mg/dL   Borderline  160-189  mg/dL   High  >190     mg/dL   Very High   Hemoglobin A1c     Status: None   Collection Time: 04/12/16  6:37 AM  Result Value Ref Range   Hgb A1c MFr Bld 5.0 4.8 - 5.6 %    Comment: (NOTE)         Pre-diabetes: 5.7 - 6.4         Diabetes: >6.4         Glycemic control for adults with diabetes: <7.0    Mean  Plasma Glucose 97 mg/dL    Comment: (NOTE) Performed At: [facility] [place], [place] [id number] Lindon Romp MD [id number]   TSH     Status: None   Collection Time: 04/12/16  6:37 AM  Result Value Ref Range   TSH 0.826 0.350 - 4.500 uIU/mL    Comment: Performed by a 3rd Generation assay with a functional sensitivity of <=0.01 uIU/mL.  Glucose, capillary     Status: None   Collection Time: 04/12/16  7:39 AM  Result Value Ref Range   Glucose-Capillary 97 65 - 99 mg/dL  Vitamin B12     Status: None   Collection Time: 04/12/16 12:29 PM  Result Value Ref Range   Vitamin B-12 227 180 - 914 pg/mL    Comment: (NOTE) This assay is not validated for testing neonatal or myeloproliferative syndrome specimens for Vitamin B12 levels. Performed at [facility]   RPR     Status: None   Collection Time: 04/12/16 12:29 PM  Result Value Ref Range   RPR Ser Ql Non Reactive Non Reactive    Comment: (NOTE) Performed At: [facility] [place], [place] [id number] Lindon Romp MD [id number]   Rapid HIV screen (HIV 1/2 Ab+Ag)     Status: None   Collection Time: 04/12/16 12:29 PM  Result Value Ref Range   HIV-1 P24 Antigen - HIV24 NON REACTIVE NON REACTIVE   HIV 1/2 Antibodies NON REACTIVE NON REACTIVE   Interpretation (HIV Ag Ab)      A non reactive test result means that HIV 1 or HIV 2 antibodies and HIV 1 p24 antigen were not detected in the specimen.  Ammonia     Status: None   Collection Time: 04/12/16 12:29 PM  Result Value Ref Range   Ammonia 13 9 - 35 umol/L  Glucose, capillary     Status: Abnormal   Collection Time: 04/13/16  6:45 AM  Result Value Ref Range   Glucose-Capillary 136 (H) 65 - 99 mg/dL    Blood Alcohol level:  No results found for: [facility]  Metabolic Disorder Labs: Lab Results  Component Value Date   HGBA1C 5.0 04/12/2016   MPG 97 04/12/2016   No results found for: PROLACTIN Lab Results  Component  Value Date   CHOL 150 04/12/2016   TRIG 86 04/12/2016   HDL 46 04/12/2016   CHOLHDL 3.3 04/12/2016   VLDL 17 04/12/2016   LDLCALC 87 04/12/2016    Physical Findings: AIMS:  , ,  ,  ,    CIWA:    COWS:     Musculoskeletal: Strength &  Muscle Tone: within normal limits Gait & Station: normal Patient leans: N/A  Psychiatric Specialty Exam: Physical Exam  Constitutional: He is oriented to person, place, and time. He appears well-developed.  HENT:  Head: Normocephalic and atraumatic.  Eyes: EOM are normal.  Neck: Normal range of motion.  Respiratory: Effort normal.  Musculoskeletal: Normal range of motion.  Neurological: He is alert and oriented to person, place, and time.    Review of Systems  Constitutional: Negative.   HENT: Negative.   Eyes: Negative.   Respiratory: Negative.   Cardiovascular: Negative.   Gastrointestinal: Negative.   Genitourinary: Negative.   Musculoskeletal: Positive for joint pain.  Skin: Negative.   Neurological: Negative.   Endo/Heme/Allergies: Negative.   Psychiatric/Behavioral: Positive for substance abuse. Negative for depression, hallucinations, memory loss and suicidal ideas. The patient has insomnia. The patient is not nervous/anxious.     Blood pressure (!) 150/88, pulse 67, temperature 98.4 F (36.9 C), temperature source Oral, resp. rate 18, height 6' 3"  (1.905 m), weight (!) 138.8 kg (306 lb), SpO2 100 %.Body mass index is 38.25 kg/m.  General Appearance: Well Groomed  Eye Contact:  Good  Speech:  Clear and Coherent  Volume:  Normal  Mood:  Euthymic  Affect:  Appropriate  Thought Process:  Linear and Descriptions of Associations: Circumstantial  Orientation:  Full (Time, Place, and Person)  Thought Content:  Hallucinations: None  Suicidal Thoughts:  No  Homicidal Thoughts:  No  Memory:  Immediate;   Fair Recent;   Fair Remote;   Fair  Judgement:  Fair  Insight:  Shallow  Psychomotor Activity:  Normal  Concentration:   Concentration: Fair and Attention Span: Fair  Recall:  [employer] of Knowledge:  Good  Language:  Good  Akathisia:  No  Handed:    AIMS (if indicated):     Assets:  Agricultural consultant Housing  ADL's:  Intact  Cognition:  WNL  Sleep:  Number of Hours: 6     Treatment Plan Summary: Daily contact with patient to assess and evaluate symptoms and progress in treatment and Medication management   The patient is a 55 year old separated Caucasian male with no known prior psychiatric history. He was referred by [facility] emergency department due to aggression and bizarre behavior (paranoid delusions about his neighbors trying to poison him and having a tracking device inserted in his body)  For psychosis: no significant evidence of psychosis at this time, perhaps mild paranoia towards neighbors but he denies delusions. Will try to collect collateral from family.  Staff will observe closely.  Will hold off from starting antipsychotics  For agitation and aggression patient has orders for Haldol, Ativan and Benadryl when necessary IM or by mouth--- no aggression or agitation since admission  For insomnia he has orders for Ativan when necessary--- patient has been is sleeping well  For history of DVT left leg he will be continued on Xarelto  For hypertension patient has been taking a combination pill with lisinopril and hydrochlorothiazide. He will be continued on hydrochlorothiazide 2.5 mg and lisinopril 20 mg a day  Past history of diabetes: Hemoglobin A1c is 5  Past history of dyslipidemia: We check a lipid panel which appeared to be in in the normal limits. Patient does not want to continue treatment with Lipitor will discontinue  Gout: Patient is states he has been taking allopurinol 100 mg day would like to continue this medication.  Cannabis use disorder patient denies the use of any alcohol or  illicit substances. His urine toxicology screen was  positive for cannabis. Upon discharge patient will be referred to outpatient substance abuse treatment--- potentially this psychotic episode could have been cause by cannabis  Low b12: will start oral b12  Diet carb modified and low sodium  Vital signs daily  Precautions every 15 minute checks  Hospitalization and status continue involuntary commitment  Labs:RPR, HIV, ammonia level, TSH---wnl.   Head CT: wnl  I reviewed his medical record. There are notes from his primary care provider Dr.Gallemore. There are no records indicating history of mental illness.  Dispo: once stable he will be d/c to his home  F/u: will set up a f/u with psychiatry. \ Social worker contacted the patient's friend. I will discuss this with social worker and see what the friend has to say.  Continue to closely observe patient to assure his safety prior to discharge.  Hildred Priest, MD 04/13/2016, 12:14 PM

## 2016-04-13 NOTE — Progress Notes (Signed)
Recreation Therapy Notes  Date: 12.21.17 Time: 9:30 am Location: Craft Room  Group Topic: Coping Skills/Leisure Education  Goal Area(s) Addresses:  Patient will identify things they are grateful for. Patient will identify how being grateful can influence decision making.  Behavioral Response: Attentive, Interactive  Intervention: Grateful Wheel  Activity: Patients were given an I Am Grateful For worksheet and were instructed to write things they are grateful for under each category.  Education: LRT educated patients on why it is important to be grateful.  Education Outcome: Acknowledges education/In group clarification offered  Clinical Observations/Feedback: Patient wrote things he was grateful for. Patient contributed to group discussion by stating things he was grateful for. Patient appeared to be getting frustrated with peer as patient would put his hands over his face when peer would talk. At the end of group, patient talked to LRT about talking to his doctor about his CAT scan. Patient also believes that the medicine he took this morning he did not need. Patient stated he asked what the medication was for, but the nurse seemed upset that he asked. LRT informed patient she would talk to the doctor to have the doctor talk to him.  Jacquelynn CreeGreene,Debie Ashline M, LRT/CTRS 04/13/2016 10:20 AM

## 2016-04-13 NOTE — [facility] Group Notes (Signed)
[facility] LCSW Group Therapy Note  Date/Time: 04/13/16 1300  Type of Therapy/Topic:  Group Therapy:  Balance in Life  Participation Level:  Appropriate  Description of Group:    This group will address the concept of balance and how it feels and looks when one is unbalanced. Patients will be encouraged to process areas in their lives that are out of balance, and identify reasons for remaining unbalanced. Facilitators will guide patients utilizing problem- solving interventions to address and correct the stressor making their life unbalanced. Understanding and applying boundaries will be explored and addressed for obtaining  and maintaining a balanced life. Patients will be encouraged to explore ways to assertively make their unbalanced needs known to significant others in their lives, using other group members and facilitator for support and feedback.  Therapeutic Goals: 1. Patient will identify two or more emotions or situations they have that consume much of in their lives. 2. Patient will identify signs/triggers that life has become out of balance:  3. Patient will identify two ways to set boundaries in order to achieve balance in their lives:  4. Patient will demonstrate ability to communicate their needs through discussion and/or role plays  Summary of Patient Progress: PT attended group and made appropriate contributions to the discussion.  Pt identified personal relationships and spiritual as areas of his life that are out of balance and was able to verbalize that he may start attending church again as a way to increase the spiritual part of his life.  Pt also shared that he attempted to deal with relationship problems with his neighbors by ignoring them, which did not work. Pt demonstrated that he was able to comprehend and consider the information presented.   Therapeutic Modalities:   Cognitive Behavioral Therapy Solution-Focused Therapy Assertiveness Training  Daleen SquibbGreg Earlene Bjelland, Kentucky[place]CS[place]

## 2016-04-13 NOTE — [facility] Group Notes (Signed)
[facility] Group Notes:  (Nursing/MHT/Case Management/Adjunct)  Date:  04/13/2016  Time:  7:24 AM  Type of Therapy:  Group Therapy  Participation Level:  Active  Participation Quality:  Appropriate  Affect:  Appropriate  Cognitive:  Appropriate  Insight:  Appropriate  Engagement in Group:  Engaged  Modes of Intervention:  n/a  Summary of Progress/Problems:  Veva Holesshley Imani Romin Divita 04/13/2016, 7:24 AM

## 2016-04-14 MED ORDER — ALLOPURINOL 100 MG PO TABS: 200.0000 mg | ORAL_TABLET | Freq: Every day | ORAL | Status: AC

## 2016-04-14 MED ORDER — FINASTERIDE 5 MG PO TABS: 2.5000 mg | ORAL_TABLET | Freq: Every day | ORAL | Status: AC

## 2016-04-14 MED ORDER — LISINOPRIL-HYDROCHLOROTHIAZIDE 20-12.5 MG PO TABS: 1.0000 | ORAL_TABLET | Freq: Every day | ORAL | 0 refills | Status: AC

## 2016-04-14 MED ORDER — CYANOCOBALAMIN 1000 MCG PO TABS: 1000.0000 ug | ORAL_TABLET | Freq: Every day | ORAL | Status: AC

## 2016-04-14 MED ORDER — RIVAROXABAN 20 MG PO TABS: 20.0000 mg | ORAL_TABLET | Freq: Every day | ORAL | Status: AC

## 2016-04-14 MED ORDER — HYDROCORTISONE 0.5 % EX CREA: 1.0000 "application " | TOPICAL_CREAM | Freq: Two times a day (BID) | CUTANEOUS | 0 refills | Status: AC

## 2016-04-14 NOTE — [facility] Suicide Risk Assessment (Signed)
[facility]   Principal Problem: Psychosis Discharge Diagnoses:  Patient Active Problem List   Diagnosis Date Noted  . Morbid obesity (HCC) [E66.01] 04/12/2016  . Gout [M10.9] 04/12/2016  . HTN (hypertension) [I10] 04/12/2016  . Cannabis use disorder, mild, abuse [F12.10] 04/12/2016  . Unspecified schizophrenia spectrum disorder [F29] 04/12/2016  . 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 rate 18, height 6\' 3"  (1.905 m), weight (!) 138.8 kg (306 lb), SpO2 100 %.Body mass index is 38.25 kg/m.                                                       Mental Status Per Nursing Assessment::   On Admission:     Demographic Factors:  Male, Divorced or widowed, Low socioeconomic status and Living alone  Loss Factors: Loss of significant relationship and Decline in physical health  Historical Factors: none  Risk Reduction Factors:   Positive social support  No access to guns  Continued Clinical Symptoms:  Alcohol/Substance Abuse/Dependencies Medical Diagnoses and Treatments/Surgeries  Cognitive Features That Contribute To Risk:  Closed-mindedness    Suicide Risk:  Minimal: No identifiable suicidal ideation.  Patients presenting with no risk factors but with morbid ruminations; may be classified as minimal risk based on the severity of the depressive symptoms    Jeffrey Greer,  Jeffrey Josten, MD 04/14/2016, 11:05 AM

## 2016-04-14 NOTE — Progress Notes (Signed)
Recreation Therapy Notes  Date: 12.22.17 Time: 9:30 am Location: Craft Room  Group Topic: Coping Skills  Goal Area(s) Addresses:  Patient will participate in coping skill activity. Patient will verbalize benefit of using art as a coping skill.  Behavioral Response: Attentive, Interactive  Intervention: Coloring  Activity: Patients were given coloring sheets and were instructed to think about their emotions and what their minds were focused on while they were coloring.  Education: LRT educated patients on healthy coping skills.  Education Outcome: Acknowledges education/In group clarification offered  Clinical Observations/Feedback: Patient colored coloring sheet. Patient contributed to group discussion by stating what his mind was focused on.  Jacquelynn CreeGreene,Patti Shorb M, LRT/CTRS 04/14/2016 10:24 AM

## 2016-04-14 NOTE — Progress Notes (Addendum)
  [facility] Discharge Plan :  Will you be returning to the same living situation after discharge:  No. Pt will be living with a friend in [place] At discharge, do you have transportation home?: Yes, pt will be transported by police Do you have the ability to pay for your medications: Yes,  pt will be provided with prescriptions at discharge  Release of information consent forms completed and in the chart;  Patient's signature needed at discharge.  Patient to Follow up at: Follow-up Information    GALLEMORE,WARREN, MD. Schedule an appointment as soon as possible for a visit in 2 day(s).   Specialty:  Internal Medicine Contact information: [address] [place] [address] [phone]        [facility] Follow up.   Why:  Please arrive for your hospital follow up and assessment for medication management, substance abuse treatment and therapy at 8:30am on Thursday December 28th, 2017 Contact information: [facility] [place] [address],  [place], [place] [address] Phone: [phone] Fax: [phone]) [phone]          Next level of care provider has access to [facility] Link:no  Safety Planning and Suicide Prevention discussed: Yes,  completed with pt  Have you used any form of tobacco in the last 30 days? (Cigarettes, Smokeless Tobacco, Cigars, and/or Pipes): No  Has patient been referred to the Quitline?: Patient refused referral  Patient has been referred for addiction treatment: Yes  Dorothe PeaJonathan F Anthonyjames Bargar 04/14/2016, 11:30 AM

## 2016-04-14 NOTE — Progress Notes (Signed)
Denies SI/HI/AVH. Discharge instructions given, verbalized understanding.  Prescriptions given, personal belongings returned.  Escorted off unit by this Clinical research associatewriter to meet Sheriffs to be transported home.

## 2016-04-14 NOTE — Progress Notes (Signed)
Spoke with the patient's friend Bryna Colander, friend, [phone], who was willing to allow the patient to stay with her. She was also willing to call the [place] had all guns removed from the patient's home. She has known the patient for many years. She reported initially that there was no prior history of psychosis. She reports that he started "talking out of his head" 1 week ago.  We contacted the [facility] for transportation back to the patient's county. I met with them prior to discharge and attended the patient has 4 warrants for his arrest at this point in time and they will be taking him to jail immediately after discharge.

## 2016-04-14 NOTE — Tx Team (Signed)
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    Current Outpatient Prescriptions  Medication Sig Dispense Refill  . [START ON 04/15/2016] allopurinol (ZYLOPRIM) 100 MG tablet Take 2 tablets (200 mg total) by mouth daily.    . finasteride (PROSCAR) 5 MG tablet Take 0.5 tablets (2.5 mg total) by mouth at bedtime.    . hydrocortisone cream 0.5 % Apply 1 application topically 2 (two) times daily. rash 30 g 0  . lisinopril-hydrochlorothiazide (ZESTORETIC) 20-12.5 MG tablet Take 1 tablet by mouth daily. 30 tablet 0  . rivaroxaban (XARELTO) 20 MG TABS tablet Take 1 tablet (20 mg total) by mouth daily with supper. 30 tablet   . [START ON 04/15/2016] vitamin B-12 1000 MCG tablet Take 1 tablet (1,000 mcg total) by mouth daily.     PTA Medications: No prescriptions prior to admission.    Patient Stressors: Marital or family conflict Medication change or noncompliance  Patient Strengths: Ability for insight Capable of independent living  Treatment Modalities: Medication Management, Group therapy, Case management,  1 to 1 session with clinician, Psychoeducation, Recreational therapy.   Physician Treatment Plan for Primary Diagnosis: Psychosis Long Term Goal(s): Improvement in symptoms so as ready for discharge Improvement in symptoms so as ready for discharge   Short Term Goals: Ability to identify changes in lifestyle to reduce recurrence of condition will improve Ability to demonstrate self-control will  improve Ability to identify and develop effective coping behaviors will improve Ability to identify triggers associated with substance abuse/mental health issues will improve Ability to demonstrate self-control will improve Ability to identify triggers associated with substance abuse/mental health issues will improve  Medication Management: Evaluate patient's response, side effects, and tolerance of medication regimen.  Therapeutic Interventions: 1 to 1 sessions, Unit Group sessions and Medication administration.  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)  Long Term Goal(s): Improvement in symptoms so as ready for discharge Improvement in symptoms so as ready for discharge   Short Term Goals: Ability to identify changes in lifestyle to reduce recurrence of condition will improve Ability to demonstrate self-control will improve Ability to identify and develop effective coping behaviors will improve Ability to identify triggers associated with substance abuse/mental health issues will improve Ability to demonstrate self-control will improve Ability to identify triggers associated with substance abuse/mental health issues will improve     Medication Management: Evaluate patient's response, side effects, and tolerance of medication regimen.  Therapeutic Interventions: 1 to 1 sessions, Unit Group sessions and Medication administration.  Evaluation of Outcomes: Adequate for discharge    RN Treatment Plan for Primary Diagnosis: Psychosis Long Term Goal(s): Knowledge of disease and therapeutic regimen to maintain health will improve  Short Term Goals: Ability to verbalize  frustration and anger appropriately will improve, Ability to participate in decision making will improve  and Ability to identify and develop effective coping behaviors will improve  Medication Management: RN will administer medications as ordered by provider, will assess and evaluate patient's response and provide education to patient for prescribed medication. RN will report any adverse and/or side effects to prescribing provider.  Therapeutic Interventions: 1 on 1 counseling sessions, Psychoeducation, Medication administration, Evaluate responses to treatment, Monitor vital signs and CBGs as ordered, Perform/monitor CIWA, COWS, AIMS and Fall Risk screenings as ordered, Perform wound care treatments as ordered.  Evaluation of Outcomes: Adequate for discharge   LCSW Treatment Plan for Primary Diagnosis: Psychosis Long Term Goal(s): Safe transition to appropriate next level of care at discharge, Engage patient in therapeutic group addressing interpersonal concerns.  Short Term Goals: Engage patient in aftercare planning with referrals and resources, Increase social support, Increase ability to appropriately verbalize feelings, Increase emotional regulation, Facilitate acceptance of mental health diagnosis and concerns and Increase skills for wellness and recovery  Therapeutic Interventions: Assess for all discharge needs, 1 to 1 time with Social worker, Explore available resources and support systems, Assess for adequacy in community support network, Educate family and significant other(s) on suicide prevention, Complete Psychosocial Assessment, Interpersonal group therapy.  Evaluation of Outcomes: Adequate for discharge   Progress in Treatment: Attending groups: No.  Participating in groups: No.  Taking medication as prescribed: Yes. Toleration medication: Yes. Family/Significant other contact made: No, will contact:  pt's family Patient understands diagnosis: Yes. Discussing patient  identified problems/goals with staff: Yes. Medical problems stabilized or resolved: Yes. Denies suicidal/homicidal ideation: Yes. Issues/concerns per patient self-inventory: Yes. Other: none listed   New problem(s) identified: No, Describe:  none listed  New Short Term/Long Term Goal(s):  Discharge Plan or Barriers: [place] and [facility], substance abuse treatment and therapy.    Reason for Continuation of Hospitalization: Anxiety Delusions  Depression Hallucinations Mania  Estimated Length of Stay: 3-5 days  Attendees: [name] 04/14/2016 10:46 AM  Physician: Dr. Ardyth HarpsHernandez, MD 04/14/2016 10:46 AM  Nursing: Elenore PaddyJennifer Morrow, R 04/14/2016 10:46 AM  RN Care Manager: 04/14/2016 10:46 AM  Social Worker: Dorothe PeaJonathan F. Roylene ReasonRiffey, LCSWA, LCAS   04/14/2016 10:46 AM  Recreational Therapist: Hershal CoriaBeth Greene, LRT 04/14/2016 10:46 AM  Other:  04/14/2016 10:46 AM  Other:  04/14/2016 10:46 AM  Other: 04/14/2016 10:46 AM    Scribe for Treatment Team: Dorothe PeaJonathan F Jameil Whitmoyer, LCSWA 04/14/2016

## 2016-04-14 NOTE — Progress Notes (Signed)
D: Patient appears blunted and bizarre. Somewhat suspicious during medication pass. Denies SI/HI/AVH. Patient has been visible in the milieu interacting with peers.  A: Medication given with education. Encouragement provided.  R: Patient was compliant with medication. He has remained calm and cooperative. Safety maintained with 15 min checks.

## 2016-04-14 NOTE — Discharge Summary (Signed)
Physician Discharge Summary Note  Patient:  Jeffrey Greer is an 55 y.o., male MRN:  [id number] DOB:  12-22-1960 Patient phone:  [phone] (home)  Patient address:   [address] [place] [address],  Total Time spent with patient: 1 hour  Date of Admission:  04/11/2016 Date of Discharge: 04/14/16  Reason for Admission:  psychosis  Principal Problem: Psychosis Discharge Diagnoses: Patient Active Problem List   Diagnosis Date Noted  . Morbid obesity ([place]) [E66.01] 04/12/2016  . Gout [M10.9] 04/12/2016  . HTN (hypertension) [I10] 04/12/2016  . Cannabis use disorder, mild, abuse [F12.10] 04/12/2016  . 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 past hisoty of DVT (deep venous thrombosis) (H[facility]) [I82.409] 04/12/2016   History of Present Illness:   The patient is a 55 year old separated Caucasian male from [place]. He was referred to our unit by [facility] emergency department. The patient was brought to [facility] on by police under petition. Per the notes on December 16 the police was called out to the home after family is stated patient was threatening them with knives. Patient was found on with a gun and had a stand off with police for 4 hours. He had disjointed thoughts, delusions of grandeur. Obsession with religious ideology. He stated he was shooting holes in the ground to ward off evil one. Afraid he is being poisoned by neighbors. Believes that he had a tracking device planted in him.  On December 17 per [facility]'s notes patient was labile, bizarre and had flight of ideas he appeared to be responding to internal stimuli. He was again reported that his neighbors were poisoning his water.  Urine toxicology was positive for marijuana. Alcohol was below detection limit. CBC and comprehensive metabolic panel were within the normal limits. Vital signs were within the normal limits.  Patient denies  having any guns with him when the police came to his house. He doesn't know who called the police as he says he lives alone. He denies thinking that his neighbors are trying to harm him. He denies thinking that there were spying on him or that anybody has implanted a tracking device on him. Patient tells me he is very hospitalized or treated for any psychiatric conditions in the past.  He denies depressed mood, problems with sleep, appetite, energy or concentration. He denies auditory or visual hallucinations. He denies any symptoms consistent with mania or hypomania.  As far as substance abuse he denies the use of alcohol or any illicit substances. He denies the use of cigarettes. He was positive for marijuana in his tox screen  Trauma history he denies any history of sexual or physical abuse or any other traumatic events in the past.  During assessment the patient was vague and guarded. He is focused on returning back to work because he is tired of being at home not doing anything. Patient says his aggravated by his neighbors who are using drugs and keep asking him for money.  Associated Signs/Symptoms: Depression Symptoms:  denies (Hypo) Manic Symptoms:  Labiality of Mood, Anxiety Symptoms:  denies Psychotic Symptoms:  Paranoia, PTSD Symptoms: Negative Total Time spent with patient: 1 hour  Past Psychiatric History: Patient denies any prior psychiatric treatment. Denies any history of suicidal attempts, psychiatric hospitalizations, denies history of self injury.    Past Medical History: History reviewed. No pertinent past medical history. History reviewed. No pertinent surgical history.   Family History: History reviewed. No pertinent family history.   Family  Psychiatric  History: Patient says he was adopted and doesn't know much about his biological family  Social History: Patient says he lives alone in a house in [place]. He has been on disability for several years  due to morbid obesity and DVTs. 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 He did some odd jobs after that but has been at home unemployed for at least 4 years. Patient has been married twice. He divorced his first wife and then remarried. He is what led to being in June of this year. Patient has 2 children ages 52 and 7 but the patient has no contact with them. 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 He denies any Careers adviser. As far as his legal history patient says that in the past he was discharged with having a gun that was not register but he says that the charges were dropped.  Patient says he was adopted he is adopted parents have passed away. He has an adopted sister but does not have any contact with her History  Alcohol use Not on file     History  Drug Use No    Social History   Social History  . Marital status: Unknown    Spouse name: N/A  . Number of children: N/A  . Years of education: N/A   Social History Main Topics  . Smoking status: Never Smoker  . Smokeless tobacco: Never Used  . Alcohol use None  . Drug use: No  . Sexual activity: No   Other Topics Concern  . None   Social History Narrative  . None    Hospital Course:    The patient is a 55 year old separated Caucasian male with no known prior psychiatric history. He was referred by [facility] emergency department due to aggression and bizarre behavior (paranoid delusions about his neighbors trying to poison him and having a tracking device inserted in his body)  For psychosis: no significant evidence of psychosis at this time, perhaps mild paranoia towards neighbors but he denies delusions.   Patient was not started on antipsychotics. The patient did not take any psychotropic medications while in the hospital. All times he was calm, pleasant and cooperative. He did not display any evidence of   Delusions, he at no time was seen interacting to internal stimuli. There was no evidence of mania or hypomania. The patient participated in programming and was not disruptive. His interactions with other patients and staff were appropriate. The patient did not require seclusion, restraints or forced medications. This hospitalization was uneventful.  Appears that the only possible cause of psychosis could had been cannabis use.  For history of DVT left leg he will be continued on Xarelto  For hypertension patient has been taking a combination pill with lisinopril and hydrochlorothiazide. He will be continued on hydrochlorothiazide 2.5 mg and lisinopril 20 mg a day  Past history of diabetes: Hemoglobin A1c is 5 (wnl)  Past history of dyslipidemia: We check a lipid panel which appeared to be in in the normal limits. Patient does not want to continue treatment with Lipitor will discontinue  Gout: continue allopurinol 100 mg day   Cannabis use disorder patient denies the use of any alcohol or illicit substances. His urine toxicology screen was positive for cannabis.Upon discharge patient will be referred to outpatient substance abuse treatment--- potentially this psychotic episode could have been cause  by cannabis  Low b12: [place] was also started on vitamin B12 orally  Labs:RPR, HIV, ammonia level, TSH---wnl.   Head CT: wnl  I reviewed his medical record. There are notes from his primary care provider Dr.Gallemore. There are no records indicating history of mental illness. Collateral information obtained from the patient's friend also confirms patient has never had any prior psychiatric history.  Dispo: Patient will be discharged today. We contacted the [place] from [facility] for transportation. A reported to [place] that he has 4 warrants and he will be taken to jail today  On discharge staff working with the patient does not voice any concern about his safety or the  safety or others upon discharge. We contacted the patient's friend who did not have any concerns about him returning home. She was willing to have him stay with her (this is before we knew that patient have warrants for his arrest)  During my assessment today the patient was calm, pleasant and cooperative. At times he can be a little scatter and requires redirection to stay on topic but he is not disorganized. There is no evidence of psychosis at this time. Status here in the hospital has reported to me that he feels medication has low IQ and this is my impression as well.   Physical Findings: AIMS:  , ,  ,  ,    CIWA:    COWS:     Musculoskeletal: Strength & Muscle Tone: within normal limits Gait & Station: normal Patient leans: N/A  Psychiatric Specialty Exam: Physical Exam  Constitutional: He is oriented to person, place, and time. He appears well-developed and well-nourished.  HENT:  Head: Normocephalic and atraumatic.  Eyes: Conjunctivae and EOM are normal.  Neck: Normal range of motion.  Respiratory: Effort normal.  Musculoskeletal: Normal range of motion.  Neurological: He is alert and oriented to person, place, and time.    Review of Systems  Constitutional: Negative.   HENT: Negative.   Eyes: Negative.   Respiratory: Negative.   Cardiovascular: Negative.   Gastrointestinal: Negative.   Genitourinary: Negative.   Musculoskeletal: Positive for joint pain.  Skin: Negative.   Neurological: Negative.   Endo/Heme/Allergies: Negative.   Psychiatric/Behavioral: Negative.     Blood pressure (!) 151/79, pulse 61, temperature 98.5 F (36.9 C), temperature source Oral, resp. rate 18, height '6\' 3"'$  (1.905 m), weight (!) 138.8 kg (306 lb), SpO2 100 %.Body mass index is 38.25 kg/m.  General Appearance: Well Groomed  Eye Contact:  Good  Speech:  Clear and Coherent  Volume:  Normal  Mood:  Anxious  Affect:  Appropriate  Thought Process:  Linear and Descriptions of  Associations: Circumstantial  Orientation:  Full (Time, Place, and Person)  Thought Content:  Hallucinations: None  Suicidal Thoughts:  No  Homicidal Thoughts:  No  Memory:  Immediate;   Fair Recent;   Fair Remote;   Fair  Judgement:  Fair  Insight:  Shallow  Psychomotor Activity:  Normal  Concentration:  Concentration: Fair and Attention Span: Fair  Recall:  Good  Fund of Knowledge:  Fair  Language:  Good  Akathisia:  No  Handed:    AIMS (if indicated):     Assets:  Armed forces logistics/support/administrative officer Social Support  ADL's:  Intact  Cognition:  WNL  Sleep:  Number of Hours: 7.25     Have you used any form of tobacco in the last 30 days? (Cigarettes, Smokeless Tobacco, Cigars, and/or Pipes): No  Has this patient used any  form of tobacco in the last 30 days? (Cigarettes, Smokeless Tobacco, Cigars, and/or Pipes) Yes, No  Blood Alcohol level:  No results found for: [facility]  Metabolic Disorder Labs:  Lab Results  Component Value Date   HGBA1C 5.0 04/12/2016   MPG 97 04/12/2016   No results found for: PROLACTIN Lab Results  Component Value Date   CHOL 150 04/12/2016   TRIG 86 04/12/2016   HDL 46 04/12/2016   CHOLHDL 3.3 04/12/2016   VLDL 17 04/12/2016   LDLCALC 87 04/12/2016   Results for THELMER, LEGLER (MRN [id number]) as of 04/14/2016 12:49  Ref. Range 04/12/2016 06:37 04/12/2016 07:39 04/12/2016 12:29 04/12/2016 13:42 04/13/2016 06:45  Sodium Latest Ref Range: 135 - 145 mmol/L 141      Potassium Latest Ref Range: 3.5 - 5.1 mmol/L 3.7      Chloride Latest Ref Range: 101 - 111 mmol/L 103      CO2 Latest Ref Range: 22 - 32 mmol/L 31      Mean Plasma Glucose Latest Units: mg/dL 97      BUN Latest Ref Range: 6 - 20 mg/dL 10      Creatinine Latest Ref Range: 0.61 - 1.24 mg/dL 0.94      Calcium Latest Ref Range: 8.9 - 10.3 mg/dL 9.3      EGFR (Non-African Amer.) Latest Ref Range: >60 mL/min >60      EGFR (African American) Latest Ref Range: >60 mL/min >60      Glucose Latest Ref  Range: 65 - 99 mg/dL 114 (H)      Anion gap Latest Ref Range: 5 - 15  7      Alkaline Phosphatase Latest Ref Range: 38 - 126 U/L 67      Albumin Latest Ref Range: 3.5 - 5.0 g/dL 3.8      AST Latest Ref Range: 15 - 41 U/L 19      ALT Latest Ref Range: 17 - 63 U/L 15 (L)      Total Protein Latest Ref Range: 6.5 - 8.1 g/dL 7.5      Ammonia Latest Ref Range: 9 - 35 umol/L   13    Total Bilirubin Latest Ref Range: 0.3 - 1.2 mg/dL 1.1      Cholesterol Latest Ref Range: 0 - 200 mg/dL 150      Triglycerides Latest Ref Range: <150 mg/dL 86      HDL Cholesterol Latest Ref Range: >40 mg/dL 46      LDL (calc) Latest Ref Range: 0 - 99 mg/dL 87      VLDL Latest Ref Range: 0 - 40 mg/dL 17      Total CHOL/HDL Ratio Latest Units: RATIO 3.3      Vitamin B12 Latest Ref Range: 180 - 914 pg/mL   227    WBC Latest Ref Range: 3.8 - 10.6 K/uL 8.2      RBC Latest Ref Range: 4.40 - 5.90 MIL/uL 4.85      Hemoglobin Latest Ref Range: 13.0 - 18.0 g/dL 15.3      HCT Latest Ref Range: 40.0 - 52.0 % 45.0      MCV Latest Ref Range: 80.0 - 100.0 fL 92.7      MCH Latest Ref Range: 26.0 - 34.0 pg 31.6      MCHC Latest Ref Range: 32.0 - 36.0 g/dL 34.1      RDW Latest Ref Range: 11.5 - 14.5 % 14.2      Platelets Latest Ref Range: 150 -  440 K/uL 190      Hemoglobin A1C Latest Ref Range: 4.8 - 5.6 % 5.0      TSH Latest Ref Range: 0.350 - 4.500 uIU/mL 0.826      RPR Latest Ref Range: Non Reactive    Non Reactive    HIV 1/2 Antibodies Latest Ref Range: NON REACTIVE    NON REACTIVE    Interpretation (HIV Ag Ab) Unknown   A non reactive te...    HIV-1 P24 Antigen - HIV24 Latest Ref Range: NON REACTIVE    NON REACTIVE      CLINICAL DATA:  Psychosis  EXAM: CT HEAD WITHOUT CONTRAST  TECHNIQUE: Contiguous axial images were obtained from the base of the skull through the vertex without intravenous contrast.  COMPARISON:  None.  FINDINGS: Brain: No mass lesion, intraparenchymal hemorrhage or extra-axial collection.  No evidence of acute cortical infarct. Brain parenchyma and CSF-containing spaces are normal for age.  Vascular: No hyperdense vessel or unexpected calcification.  Skull: Normal visualized skull base, calvarium and extracranial soft tissues.  Sinuses/Orbits: No sinus fluid levels or advanced mucosal thickening. No mastoid effusion. Normal orbits.  IMPRESSION: Normal head CT for age.   See Psychiatric Specialty Exam and Suicide Risk Assessment completed by Attending Physician prior to discharge.  Discharge destination:  Other:  jail  Is patient on multiple antipsychotic therapies at discharge:  No   Has Patient had three or more failed trials of antipsychotic monotherapy by history:  No  Recommended Plan for Multiple Antipsychotic Therapies: NA  Discharge Instructions    Diet - low sodium heart healthy    Complete by:  As directed      Allergies as of 04/14/2016   Not on File     Medication List    TAKE these medications     Indication  allopurinol 100 MG tablet Commonly known as:  ZYLOPRIM Take 2 tablets (200 mg total) by mouth daily. Start taking on:  04/15/2016  Indication:  gout   cyanocobalamin 1000 MCG tablet Take 1 tablet (1,000 mcg total) by mouth daily. Start taking on:  04/15/2016  Indication:  Inadequate Vitamin B12   finasteride 5 MG tablet Commonly known as:  PROSCAR Take 0.5 tablets (2.5 mg total) by mouth at bedtime.  Indication:  Benign Enlargement of Prostate   hydrocortisone cream 0.5 % Apply 1 application topically 2 (two) times daily. rash  Indication:  Itching   lisinopril-hydrochlorothiazide 20-12.5 MG tablet Commonly known as:  ZESTORETIC Take 1 tablet by mouth daily.  Indication:  High Blood Pressure Disorder   rivaroxaban 20 MG Tabs tablet Commonly known as:  XARELTO Take 1 tablet (20 mg total) by mouth daily with supper.  Indication:  Blood Clot in a Deep Vein      Follow-up Information    GALLEMORE,WARREN, MD.  Schedule an appointment as soon as possible for a visit in 2 day(s).   Specialty:  Internal Medicine Contact information: [place] [address] [phone]        [facility] Follow up.   Why:  Please arrive for your hospital follow up and assessment for medication management, substance abuse treatment and therapy at 8:30am on Thursday December 28th, 2017 Contact information: [place] [facility] [address]  [place], [place] [address] Phone: [phone] Fax: [phone]         >30 minutes. >50 % of the time was spent in coordination of care.   Signed: Hildred Priest, MD 04/14/2016, 12:57  PM

## 2020-03-27 ENCOUNTER — Other Ambulatory Visit: Payer: Self-pay

## 2020-03-27 ENCOUNTER — Ambulatory Visit (HOSPITAL_COMMUNITY)
Admission: EM | Admit: 2020-03-27 | Discharge: 2020-03-27 | Disposition: A | Discharge status: Home / Self Care | Payer: Medicare Other

## 2020-03-27 DIAGNOSIS — S81811A Laceration without foreign body, right lower leg, initial encounter: Secondary | ICD-10-CM

## 2020-03-27 MED ORDER — LIDOCAINE-EPINEPHRINE 1 %-1:100000 IJ SOLN
INTRAMUSCULAR | Status: AC
  Filled 2020-03-27: qty 1

## 2020-03-27 NOTE — Discharge Instructions (Signed)
Keep clean and dry.  Return here or Emergency Department if bleeding continues/returns  Follow up with Primary Care early next week for recheck.
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
## 2020-11-05 ENCOUNTER — Ambulatory Visit (HOSPITAL_COMMUNITY): Admission: EM | Admit: 2020-11-05 | Payer: BLUE CROSS/BLUE SHIELD | Source: Home / Self Care

## 2020-11-05 ENCOUNTER — Emergency Department (HOSPITAL_COMMUNITY)
Admission: EM | Admit: 2020-11-05 | Discharge: 2020-11-05 | Disposition: A | Discharge status: Home / Self Care | Payer: Medicare Other | Attending: Emergency Medicine | Admitting: Emergency Medicine

## 2020-11-05 ENCOUNTER — Other Ambulatory Visit: Payer: Self-pay

## 2020-11-05 ENCOUNTER — Emergency Department (HOSPITAL_COMMUNITY): Payer: Medicare Other

## 2020-11-05 DIAGNOSIS — S0101XA Laceration without foreign body of scalp, initial encounter: Secondary | ICD-10-CM | POA: Diagnosis not present

## 2020-11-05 DIAGNOSIS — Z79899 Other long term (current) drug therapy: Secondary | ICD-10-CM | POA: Insufficient documentation

## 2020-11-05 DIAGNOSIS — R040 Epistaxis: Secondary | ICD-10-CM | POA: Insufficient documentation

## 2020-11-05 DIAGNOSIS — Z23 Encounter for immunization: Secondary | ICD-10-CM | POA: Insufficient documentation

## 2020-11-05 DIAGNOSIS — T1490XA Injury, unspecified, initial encounter: Secondary | ICD-10-CM

## 2020-11-05 DIAGNOSIS — I10 Essential (primary) hypertension: Secondary | ICD-10-CM | POA: Diagnosis not present

## 2020-11-05 DIAGNOSIS — S0990XA Unspecified injury of head, initial encounter: Secondary | ICD-10-CM | POA: Diagnosis present

## 2020-11-05 DIAGNOSIS — Z7901 Long term (current) use of anticoagulants: Secondary | ICD-10-CM | POA: Insufficient documentation

## 2020-11-05 MED ORDER — TETANUS-DIPHTH-ACELL PERTUSSIS 5-2.5-18.5 LF-MCG/0.5 IM SUSY
0.5000 mL | PREFILLED_SYRINGE | Freq: Once | INTRAMUSCULAR | Status: AC
  Administered 2020-11-05: 0.5 mL via INTRAMUSCULAR
  Filled 2020-11-05: qty 0.5

## 2020-11-05 MED ORDER — LIDOCAINE-EPINEPHRINE-TETRACAINE (LET) TOPICAL GEL
3.0000 mL | Freq: Once | TOPICAL | Status: AC
  Administered 2020-11-05: 3 mL via TOPICAL
  Filled 2020-11-05: qty 3

## 2020-11-05 NOTE — ED Notes (Signed)
Pt refusing care by this RN. Pt cleaning face with purple wipes. This RN explained those are not for personal use especially on the body. Pt refused to give the wipe to RN and stated he didn't care. This RN explained why those are not used, pt continues to be argumentative and continues to wipe face with purple wipe.
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
## 2020-11-05 NOTE — ED Notes (Signed)
Patient transported to CT 
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
## 2020-11-05 NOTE — Discharge Instructions (Addendum)
You were evaluated in the Emergency Department and after careful evaluation, we did not find any emergent condition requiring admission or further testing in the hospital.  Keep area clean by washing with soap and water daily. Do not submerge in water or scrub stitches Apply a bandage at least once daily, change more often if it is dirty Watch for signs of infection (redness, drainage, worsening pain) Take Tylenol or Ibuprofen for pain as needed Have stitches removed in 7-10 days   Please return to the Emergency Department if you experience any worsening of your condition.  We encourage you to follow up with a primary care provider.  Thank you for allowing [place] to be a part of your care.

## 2020-11-05 NOTE — ED Provider Notes (Signed)
PT AAx4. Discussed that given history and medication he will need further evaluation in the ER. They prefer private vehicle transfer instead of recommended EMS. Given that he has intact vitals, is Aax4 and the hospital is across our parking lot I am ok with this.    Rushie Chestnut, [place] 11/05/20 1357
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
